# Patient Record
Sex: Male | Born: 1965 | Race: White | Hispanic: No | Marital: Married | Smoking: Never smoker
Health system: Southern US, Community
[De-identification: ages and names within clinical notes are randomized; demographics above are authoritative.]

## PROBLEM LIST (undated history)

## (undated) DIAGNOSIS — S6980XA Other specified injuries of unspecified wrist, hand and finger(s), initial encounter: Secondary | ICD-10-CM

## (undated) DIAGNOSIS — F329 Major depressive disorder, single episode, unspecified: Secondary | ICD-10-CM

## (undated) DIAGNOSIS — G5621 Lesion of ulnar nerve, right upper limb: Secondary | ICD-10-CM

## (undated) DIAGNOSIS — F32A Depression, unspecified: Secondary | ICD-10-CM

## (undated) DIAGNOSIS — M1990 Unspecified osteoarthritis, unspecified site: Secondary | ICD-10-CM

## (undated) HISTORY — PX: BACK SURGERY: SHX140

## (undated) HISTORY — PX: HERNIA REPAIR: SHX51

---

## 2004-06-22 ENCOUNTER — Encounter: Admission: RE | Admit: 2004-06-22 | Discharge: 2004-06-22 | Payer: Self-pay | Admitting: Family Medicine

## 2004-07-25 ENCOUNTER — Encounter: Admission: RE | Admit: 2004-07-25 | Discharge: 2004-07-25 | Payer: Self-pay | Admitting: Neurosurgery

## 2004-07-30 ENCOUNTER — Encounter: Admission: RE | Admit: 2004-07-30 | Discharge: 2004-07-30 | Payer: Self-pay | Admitting: Neurosurgery

## 2004-09-06 ENCOUNTER — Inpatient Hospital Stay (HOSPITAL_COMMUNITY): Admission: RE | Admit: 2004-09-06 | Discharge: 2004-09-07 | Payer: Self-pay | Admitting: Neurosurgery

## 2004-10-24 ENCOUNTER — Encounter: Admission: RE | Admit: 2004-10-24 | Discharge: 2004-10-24 | Payer: Self-pay | Admitting: Neurosurgery

## 2004-12-10 ENCOUNTER — Encounter: Admission: RE | Admit: 2004-12-10 | Discharge: 2004-12-10 | Payer: Self-pay | Admitting: Neurosurgery

## 2010-10-27 ENCOUNTER — Encounter: Payer: Self-pay | Admitting: Neurosurgery

## 2010-11-22 ENCOUNTER — Other Ambulatory Visit: Payer: Self-pay | Admitting: Neurosurgery

## 2010-11-22 DIAGNOSIS — M545 Low back pain: Secondary | ICD-10-CM

## 2010-12-05 ENCOUNTER — Ambulatory Visit
Admission: RE | Admit: 2010-12-05 | Discharge: 2010-12-05 | Disposition: A | Discharge status: Home / Self Care | Payer: 59 | Source: Ambulatory Visit | Attending: Neurosurgery | Admitting: Neurosurgery

## 2010-12-05 DIAGNOSIS — M545 Low back pain: Secondary | ICD-10-CM

## 2010-12-05 MED ORDER — GADOBENATE DIMEGLUMINE 529 MG/ML IV SOLN
15.0000 mL | Freq: Once | INTRAVENOUS | Status: AC | PRN
  Administered 2010-12-05: 15 mL via INTRAVENOUS

## 2017-09-24 DIAGNOSIS — J069 Acute upper respiratory infection, unspecified: Secondary | ICD-10-CM | POA: Diagnosis not present

## 2017-09-24 DIAGNOSIS — R05 Cough: Secondary | ICD-10-CM | POA: Diagnosis not present

## 2017-10-01 DIAGNOSIS — J209 Acute bronchitis, unspecified: Secondary | ICD-10-CM | POA: Diagnosis not present

## 2017-11-13 DIAGNOSIS — E291 Testicular hypofunction: Secondary | ICD-10-CM | POA: Diagnosis not present

## 2017-11-13 DIAGNOSIS — Z0000 Encounter for general adult medical examination without abnormal findings: Secondary | ICD-10-CM | POA: Diagnosis not present

## 2017-11-13 DIAGNOSIS — M5136 Other intervertebral disc degeneration, lumbar region: Secondary | ICD-10-CM | POA: Diagnosis not present

## 2017-11-13 DIAGNOSIS — R1310 Dysphagia, unspecified: Secondary | ICD-10-CM | POA: Diagnosis not present

## 2017-11-17 DIAGNOSIS — Z0000 Encounter for general adult medical examination without abnormal findings: Secondary | ICD-10-CM | POA: Diagnosis not present

## 2017-12-11 DIAGNOSIS — Z0000 Encounter for general adult medical examination without abnormal findings: Secondary | ICD-10-CM | POA: Diagnosis not present

## 2018-01-27 DIAGNOSIS — M2550 Pain in unspecified joint: Secondary | ICD-10-CM | POA: Diagnosis not present

## 2018-01-27 DIAGNOSIS — Z79899 Other long term (current) drug therapy: Secondary | ICD-10-CM | POA: Diagnosis not present

## 2018-01-27 DIAGNOSIS — R768 Other specified abnormal immunological findings in serum: Secondary | ICD-10-CM | POA: Diagnosis not present

## 2018-10-20 ENCOUNTER — Other Ambulatory Visit: Payer: Self-pay | Admitting: Orthopedic Surgery

## 2018-11-03 ENCOUNTER — Other Ambulatory Visit: Payer: Self-pay

## 2018-11-03 ENCOUNTER — Encounter (HOSPITAL_BASED_OUTPATIENT_CLINIC_OR_DEPARTMENT_OTHER): Payer: Self-pay | Admitting: *Deleted

## 2018-11-09 NOTE — Anesthesia Preprocedure Evaluation (Addendum)
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  Reproductive/Obstetrics                            Anesthesia Physical Anesthesia Plan  ASA: II  Anesthesia Plan: General   Post-op Pain Management: GA combined w/ Regional for post-op pain   Induction: Intravenous  PONV Risk Score and Plan: 1 and Treatment may vary due to age or medical condition, Ondansetron, Midazolam and Dexamethasone  Airway Management Planned: LMA  Additional Equipment:   Intra-op Plan:   Post-operative Plan: Extubation in OR  Informed Consent: I have reviewed the patients History and Physical, chart, labs and discussed the procedure including the risks, benefits and alternatives for the proposed anesthesia with the patient or authorized representative who has indicated his/her understanding and acceptance.     Dental advisory given  Plan Discussed with: CRNA  Anesthesia Plan Comments:        Anesthesia Quick Evaluation

## 2018-11-10 ENCOUNTER — Encounter (HOSPITAL_BASED_OUTPATIENT_CLINIC_OR_DEPARTMENT_OTHER)
Admission: RE | Disposition: A | Discharge status: Home / Self Care | Payer: Self-pay | Source: Home / Self Care | Attending: Orthopedic Surgery

## 2018-11-10 ENCOUNTER — Ambulatory Visit (HOSPITAL_BASED_OUTPATIENT_CLINIC_OR_DEPARTMENT_OTHER)
Admission: RE | Admit: 2018-11-10 | Discharge: 2018-11-10 | Disposition: A | Discharge status: Home / Self Care | Payer: 59 | Attending: Orthopedic Surgery | Admitting: Orthopedic Surgery

## 2018-11-10 ENCOUNTER — Encounter (HOSPITAL_BASED_OUTPATIENT_CLINIC_OR_DEPARTMENT_OTHER): Payer: Self-pay | Admitting: *Deleted

## 2018-11-10 ENCOUNTER — Other Ambulatory Visit: Payer: Self-pay

## 2018-11-10 ENCOUNTER — Ambulatory Visit (HOSPITAL_BASED_OUTPATIENT_CLINIC_OR_DEPARTMENT_OTHER): Payer: 59 | Admitting: Anesthesiology

## 2018-11-10 DIAGNOSIS — X501XXA Overexertion from prolonged static or awkward postures, initial encounter: Secondary | ICD-10-CM | POA: Diagnosis not present

## 2018-11-10 DIAGNOSIS — S63511A Sprain of carpal joint of right wrist, initial encounter: Secondary | ICD-10-CM | POA: Insufficient documentation

## 2018-11-10 DIAGNOSIS — Z79899 Other long term (current) drug therapy: Secondary | ICD-10-CM | POA: Diagnosis not present

## 2018-11-10 DIAGNOSIS — Z79890 Hormone replacement therapy (postmenopausal): Secondary | ICD-10-CM | POA: Diagnosis not present

## 2018-11-10 DIAGNOSIS — M65831 Other synovitis and tenosynovitis, right forearm: Secondary | ICD-10-CM | POA: Insufficient documentation

## 2018-11-10 DIAGNOSIS — Z886 Allergy status to analgesic agent status: Secondary | ICD-10-CM | POA: Diagnosis not present

## 2018-11-10 DIAGNOSIS — G5603 Carpal tunnel syndrome, bilateral upper limbs: Secondary | ICD-10-CM | POA: Insufficient documentation

## 2018-11-10 DIAGNOSIS — Z881 Allergy status to other antibiotic agents status: Secondary | ICD-10-CM | POA: Diagnosis not present

## 2018-11-10 DIAGNOSIS — Z791 Long term (current) use of non-steroidal anti-inflammatories (NSAID): Secondary | ICD-10-CM | POA: Insufficient documentation

## 2018-11-10 DIAGNOSIS — F329 Major depressive disorder, single episode, unspecified: Secondary | ICD-10-CM | POA: Insufficient documentation

## 2018-11-10 DIAGNOSIS — M1990 Unspecified osteoarthritis, unspecified site: Secondary | ICD-10-CM | POA: Diagnosis not present

## 2018-11-10 DIAGNOSIS — G5623 Lesion of ulnar nerve, bilateral upper limbs: Secondary | ICD-10-CM | POA: Diagnosis present

## 2018-11-10 HISTORY — DX: Unspecified osteoarthritis, unspecified site: M19.90

## 2018-11-10 HISTORY — PX: CARPAL TUNNEL RELEASE: SHX101

## 2018-11-10 HISTORY — DX: Depression, unspecified: F32.A

## 2018-11-10 HISTORY — PX: WRIST ARTHROSCOPY WITH DEBRIDEMENT: SHX6194

## 2018-11-10 HISTORY — DX: Lesion of ulnar nerve, right upper limb: G56.21

## 2018-11-10 HISTORY — PX: ULNAR NERVE TRANSPOSITION: SHX2595

## 2018-11-10 HISTORY — DX: Major depressive disorder, single episode, unspecified: F32.9

## 2018-11-10 SURGERY — CARPAL TUNNEL RELEASE
Anesthesia: General | Site: Wrist | Laterality: Right

## 2018-11-10 MED ORDER — OXYCODONE HCL 5 MG/5ML PO SOLN: 5.0000 mg | Freq: Once | ORAL | Status: DC | PRN

## 2018-11-10 MED ORDER — FENTANYL CITRATE (PF) 100 MCG/2ML IJ SOLN
50.0000 ug | INTRAMUSCULAR | Status: DC | PRN
  Administered 2018-11-10: 50 ug via INTRAVENOUS

## 2018-11-10 MED ORDER — PROMETHAZINE HCL 25 MG/ML IJ SOLN: 6.2500 mg | INTRAMUSCULAR | Status: DC | PRN

## 2018-11-10 MED ORDER — OXYCODONE HCL 5 MG PO TABS: 5.0000 mg | ORAL_TABLET | Freq: Once | ORAL | Status: DC | PRN

## 2018-11-10 MED ORDER — HYDROCODONE-ACETAMINOPHEN 5-325 MG PO TABS: 1.0000 | ORAL_TABLET | Freq: Four times a day (QID) | ORAL | 0 refills | Status: DC | PRN

## 2018-11-10 MED ORDER — MIDAZOLAM HCL 2 MG/2ML IJ SOLN
1.0000 mg | INTRAMUSCULAR | Status: DC | PRN
  Administered 2018-11-10: 1 mg via INTRAVENOUS

## 2018-11-10 MED ORDER — ONDANSETRON HCL 4 MG/2ML IJ SOLN
INTRAMUSCULAR | Status: AC
  Filled 2018-11-10: qty 2

## 2018-11-10 MED ORDER — CHLORHEXIDINE GLUCONATE 4 % EX LIQD: 60.0000 mL | Freq: Once | CUTANEOUS | Status: DC

## 2018-11-10 MED ORDER — FENTANYL CITRATE (PF) 100 MCG/2ML IJ SOLN: 25.0000 ug | INTRAMUSCULAR | Status: DC | PRN

## 2018-11-10 MED ORDER — SCOPOLAMINE 1 MG/3DAYS TD PT72: 1.0000 | MEDICATED_PATCH | Freq: Once | TRANSDERMAL | Status: DC | PRN

## 2018-11-10 MED ORDER — PROPOFOL 10 MG/ML IV BOLUS
INTRAVENOUS | Status: DC | PRN
  Administered 2018-11-10: 150 mg via INTRAVENOUS

## 2018-11-10 MED ORDER — BUPIVACAINE-EPINEPHRINE (PF) 0.5% -1:200000 IJ SOLN
INTRAMUSCULAR | Status: DC | PRN
  Administered 2018-11-10: 30 mL via PERINEURAL

## 2018-11-10 MED ORDER — LACTATED RINGERS IV SOLN
INTRAVENOUS | Status: DC
  Administered 2018-11-10 (×2): via INTRAVENOUS

## 2018-11-10 MED ORDER — ACETAMINOPHEN 10 MG/ML IV SOLN: 1000.0000 mg | Freq: Once | INTRAVENOUS | Status: DC | PRN

## 2018-11-10 MED ORDER — LIDOCAINE 2% (20 MG/ML) 5 ML SYRINGE
INTRAMUSCULAR | Status: DC | PRN
  Administered 2018-11-10: 40 mg via INTRAVENOUS

## 2018-11-10 MED ORDER — CEFAZOLIN SODIUM-DEXTROSE 2-4 GM/100ML-% IV SOLN
INTRAVENOUS | Status: AC
  Filled 2018-11-10: qty 100

## 2018-11-10 MED ORDER — ONDANSETRON HCL 4 MG/2ML IJ SOLN
INTRAMUSCULAR | Status: DC | PRN
  Administered 2018-11-10: 4 mg via INTRAVENOUS

## 2018-11-10 MED ORDER — FENTANYL CITRATE (PF) 100 MCG/2ML IJ SOLN
INTRAMUSCULAR | Status: AC
  Filled 2018-11-10: qty 2

## 2018-11-10 MED ORDER — DEXAMETHASONE SODIUM PHOSPHATE 10 MG/ML IJ SOLN
INTRAMUSCULAR | Status: DC | PRN
  Administered 2018-11-10: 10 mg via INTRAVENOUS

## 2018-11-10 MED ORDER — DEXAMETHASONE SODIUM PHOSPHATE 10 MG/ML IJ SOLN
INTRAMUSCULAR | Status: AC
  Filled 2018-11-10: qty 1

## 2018-11-10 MED ORDER — CEFAZOLIN SODIUM-DEXTROSE 2-4 GM/100ML-% IV SOLN
2.0000 g | INTRAVENOUS | Status: AC
  Administered 2018-11-10: 2 g via INTRAVENOUS

## 2018-11-10 MED ORDER — MIDAZOLAM HCL 2 MG/2ML IJ SOLN
INTRAMUSCULAR | Status: AC
  Filled 2018-11-10: qty 2

## 2018-11-10 SURGICAL SUPPLY — 91 items
BLADE CUDA 2.0 (BLADE) IMPLANT
BLADE EAR TYMPAN 2.5 60D BEAV (BLADE) IMPLANT
BLADE MINI RND TIP GREEN BEAV (BLADE) IMPLANT
BLADE SURG 15 STRL LF DISP TIS (BLADE) ×2 IMPLANT
BLADE SURG 15 STRL SS (BLADE) ×4
BNDG COHESIVE 3X5 TAN STRL LF (GAUZE/BANDAGES/DRESSINGS) ×8 IMPLANT
BNDG ESMARK 4X9 LF (GAUZE/BANDAGES/DRESSINGS) ×4 IMPLANT
BNDG GAUZE ELAST 4 BULKY (GAUZE/BANDAGES/DRESSINGS) ×4 IMPLANT
BUR CUDA 2.9 (BURR) IMPLANT
BUR CUDA 2.9MM (BURR)
BUR FULL RADIUS 2.0 (BURR) ×3 IMPLANT
BUR FULL RADIUS 2.0MM (BURR) ×1
BUR FULL RADIUS 2.9 (BURR) IMPLANT
BUR FULL RADIUS 2.9MM (BURR)
BUR GATOR 2.9 (BURR) IMPLANT
BUR GATOR 2.9MM (BURR)
BUR SPHERICAL 2.9 (BURR) IMPLANT
BUR SPHERICAL 2.9MM (BURR)
CANISTER SUCT 1200ML W/VALVE (MISCELLANEOUS) ×4 IMPLANT
CHLORAPREP W/TINT 26ML (MISCELLANEOUS) ×4 IMPLANT
CORD BIPOLAR FORCEPS 12FT (ELECTRODE) ×4 IMPLANT
COVER BACK TABLE 60X90IN (DRAPES) ×4 IMPLANT
COVER MAYO STAND STRL (DRAPES) ×4 IMPLANT
COVER WAND RF STERILE (DRAPES) IMPLANT
CUFF TOURN SGL LL 18 NRW (TOURNIQUET CUFF) ×4 IMPLANT
CUFF TOURNIQUET SINGLE 18IN (TOURNIQUET CUFF) ×4 IMPLANT
DECANTER SPIKE VIAL GLASS SM (MISCELLANEOUS) IMPLANT
DRAPE EXTREMITY T 121X128X90 (DISPOSABLE) ×4 IMPLANT
DRAPE IMP U-DRAPE 54X76 (DRAPES) ×4 IMPLANT
DRAPE OEC MINIVIEW 54X84 (DRAPES) IMPLANT
DRAPE SURG 17X23 STRL (DRAPES) ×4 IMPLANT
DRSG PAD ABDOMINAL 8X10 ST (GAUZE/BANDAGES/DRESSINGS) ×4 IMPLANT
ELECT SMALL JOINT 90D BASC (ELECTRODE) IMPLANT
GAUZE 4X4 16PLY RFD (DISPOSABLE) IMPLANT
GAUZE SPONGE 4X4 12PLY STRL (GAUZE/BANDAGES/DRESSINGS) ×4 IMPLANT
GAUZE XEROFORM 1X8 LF (GAUZE/BANDAGES/DRESSINGS) ×4 IMPLANT
GLOVE BIO SURGEON STRL SZ 6.5 (GLOVE) ×3 IMPLANT
GLOVE BIO SURGEONS STRL SZ 6.5 (GLOVE) ×1
GLOVE BIOGEL PI IND STRL 7.0 (GLOVE) ×4 IMPLANT
GLOVE BIOGEL PI IND STRL 8.5 (GLOVE) ×2 IMPLANT
GLOVE BIOGEL PI INDICATOR 7.0 (GLOVE) ×4
GLOVE BIOGEL PI INDICATOR 8.5 (GLOVE) ×2
GLOVE SURG ORTHO 8.0 STRL STRW (GLOVE) ×4 IMPLANT
GOWN STRL REUS W/ TWL LRG LVL3 (GOWN DISPOSABLE) ×2 IMPLANT
GOWN STRL REUS W/TWL LRG LVL3 (GOWN DISPOSABLE) ×4
GOWN STRL REUS W/TWL XL LVL3 (GOWN DISPOSABLE) ×8 IMPLANT
IV NS IRRIG 3000ML ARTHROMATIC (IV SOLUTION) ×4 IMPLANT
IV SET EXT 30 76VOL 4 MALE LL (IV SETS) ×4 IMPLANT
LOOP VESSEL MAXI BLUE (MISCELLANEOUS) IMPLANT
MANIFOLD NEPTUNE II (INSTRUMENTS) IMPLANT
NDL SAFETY ECLIPSE 18X1.5 (NEEDLE) ×2 IMPLANT
NEEDLE HYPO 18GX1.5 SHARP (NEEDLE) ×4
NEEDLE HYPO 22GX1.5 SAFETY (NEEDLE) ×4 IMPLANT
NEEDLE PRECISIONGLIDE 27X1.5 (NEEDLE) IMPLANT
NEEDLE SPNL 18GX3.5 QUINCKE PK (NEEDLE) IMPLANT
NEEDLE TUOHY 20GX3.5 (NEEDLE) IMPLANT
NS IRRIG 1000ML POUR BTL (IV SOLUTION) ×4 IMPLANT
PACK BASIN DAY SURGERY FS (CUSTOM PROCEDURE TRAY) ×4 IMPLANT
PAD CAST 3X4 CTTN HI CHSV (CAST SUPPLIES) ×2 IMPLANT
PAD CAST 4YDX4 CTTN HI CHSV (CAST SUPPLIES) ×2 IMPLANT
PADDING CAST ABS 3INX4YD NS (CAST SUPPLIES) ×2
PADDING CAST ABS 4INX4YD NS (CAST SUPPLIES) ×2
PADDING CAST ABS COTTON 3X4 (CAST SUPPLIES) ×2 IMPLANT
PADDING CAST ABS COTTON 4X4 ST (CAST SUPPLIES) ×2 IMPLANT
PADDING CAST COTTON 3X4 STRL (CAST SUPPLIES) ×4
PADDING CAST COTTON 4X4 STRL (CAST SUPPLIES) ×4
ROUTER HOODED VORTEX 2.9MM (BLADE) IMPLANT
SET SM JOINT TUBING/CANN (CANNULA) IMPLANT
SLEEVE SCD COMPRESS KNEE MED (MISCELLANEOUS) ×4 IMPLANT
SPLINT PLASTER CAST XFAST 3X15 (CAST SUPPLIES) ×60 IMPLANT
SPLINT PLASTER XTRA FASTSET 3X (CAST SUPPLIES) ×60
STOCKINETTE 4X48 STRL (DRAPES) ×4 IMPLANT
SUCTION FRAZIER HANDLE 10FR (MISCELLANEOUS)
SUCTION TUBE FRAZIER 10FR DISP (MISCELLANEOUS) IMPLANT
SUT ETHILON 4 0 PS 2 18 (SUTURE) ×4 IMPLANT
SUT MERSILENE 4 0 P 3 (SUTURE) IMPLANT
SUT PDS AB 2-0 CT2 27 (SUTURE) IMPLANT
SUT STEEL 4 0 (SUTURE) IMPLANT
SUT VIC AB 2-0 PS2 27 (SUTURE) ×4 IMPLANT
SUT VIC AB 2-0 SH 27 (SUTURE) ×3
SUT VIC AB 2-0 SH 27XBRD (SUTURE) ×2 IMPLANT
SUT VICRYL 4-0 PS2 18IN ABS (SUTURE) ×4 IMPLANT
SYR BULB 3OZ (MISCELLANEOUS) ×4 IMPLANT
SYR CONTROL 10ML LL (SYRINGE) ×4 IMPLANT
TOWEL GREEN STERILE FF (TOWEL DISPOSABLE) ×4 IMPLANT
TUBE CONNECTING 20'X1/4 (TUBING)
TUBE CONNECTING 20X1/4 (TUBING) IMPLANT
TUBING ARTHROSCOPY IRRIG 16FT (MISCELLANEOUS) ×4 IMPLANT
UNDERPAD 30X30 (UNDERPADS AND DIAPERS) ×4 IMPLANT
WAND SHORT BEVEL W/CORD (SURGICAL WAND) ×4 IMPLANT
WATER STERILE IRR 1000ML POUR (IV SOLUTION) ×4 IMPLANT

## 2018-11-10 NOTE — Op Note (Signed)
I assisted Surgeon(s) and Role:    * Cindee Salt, MD - Primary    * Betha Loa, MD - Assisting on the Procedure(s): CARPAL TUNNEL RELEASE WRIST ARTHROSCOPY WITH DEBRIDEMENT ULNAR NERVE DECOMPRESSION/TRANSPOSITION on 11/10/2018.  I provided assistance on this case as follows: positioning of arm, management of equipment, retraction soft tissues.  Electronically signed by: Betha Loa, MD Date: 11/10/2018 Time: 2:54 PM

## 2018-11-10 NOTE — Discharge Instructions (Addendum)
° °  ° ° ° °Hand Center Instructions °Hand Surgery ° °Wound Care: °Keep your hand elevated above the level of your heart.  Do not allow it to dangle by your side.  Keep the dressing dry and do not remove it unless your doctor advises you to do so.  He will usually change it at the time of your post-op visit.  Moving your fingers is advised to stimulate circulation but will depend on the site of your surgery.  If you have a splint applied, your doctor will advise you regarding movement. ° °Activity: °Do not drive or operate machinery today.  Rest today and then you may return to your normal activity and work as indicated by your physician. ° °Diet:  °Drink liquids today or eat a light diet.  You may resume a regular diet tomorrow.   ° °General expectations: °Pain for two to three days. °Fingers may become slightly swollen. ° °Call your doctor if any of the following occur: °Severe pain not relieved by pain medication. °Elevated temperature. °Dressing soaked with blood. °Inability to move fingers. °White or bluish color to fingers. ° ° °Regional Anesthesia Blocks ° °1. Numbness or the inability to move the "blocked" extremity may last from 3-48 hours after placement. The length of time depends on the medication injected and your individual response to the medication. If the numbness is not going away after 48 hours, call your surgeon. ° °2. The extremity that is blocked will need to be protected until the numbness is gone and the  Strength has returned. Because you cannot feel it, you will need to take extra care to avoid injury. Because it may be weak, you may have difficulty moving it or using it. You may not know what position it is in without looking at it while the block is in effect. ° °3. For blocks in the legs and feet, returning to weight bearing and walking needs to be done carefully. You will need to wait until the numbness is entirely gone and the strength has returned. You should be able to move your leg  and foot normally before you try and bear weight or walk. You will need someone to be with you when you first try to ensure you do not fall and possibly risk injury. ° °4. Bruising and tenderness at the needle site are common side effects and will resolve in a few days. ° °5. Persistent numbness or new problems with movement should be communicated to the surgeon or the [facility] ([phone])/ [facility] ([phone]). ° ° ° °Post Anesthesia Home Care Instructions ° °Activity: °Get plenty of rest for the remainder of the day. A responsible individual must stay with you for 24 hours following the procedure.  °For the next 24 hours, DO NOT: °-Drive a car °-Operate machinery °-Drink alcoholic beverages °-Take any medication unless instructed by your physician °-Make any legal decisions or sign important papers. ° °Meals: °Start with liquid foods such as gelatin or soup. Progress to regular foods as tolerated. Avoid greasy, spicy, heavy foods. If nausea and/or vomiting occur, drink only clear liquids until the nausea and/or vomiting subsides. Call your physician if vomiting continues. ° °Special Instructions/Symptoms: °Your throat may feel dry or sore from the anesthesia or the breathing tube placed in your throat during surgery. If this causes discomfort, gargle with warm salt water. The discomfort should disappear within 24 hours. ° °If you had a scopolamine patch placed behind your ear for   the management of post- operative nausea and/or vomiting: ° °1. The medication in the patch is effective for 72 hours, after which it should be removed.  Wrap patch in a tissue and discard in the trash. Wash hands thoroughly with soap and water. °2. You may remove the patch earlier than 72 hours if you experience unpleasant side effects which may include dry mouth, dizziness or visual disturbances. °3. Avoid touching the patch. Wash your hands with soap and water after contact with the patch. °  ° ° °

## 2018-11-10 NOTE — Transfer of Care (Signed)
Immediate Anesthesia Transfer of Care Note  Patient: Tony [name]  Procedure(s) Performed: CARPAL TUNNEL RELEASE (Right Wrist) WRIST ARTHROSCOPY WITH DEBRIDEMENT WITH SHINKAGE (Right Wrist) ULNAR NERVE DECOMPRESSION/TRANSPOSITION (Right Elbow)  Patient Location: PACU  Anesthesia Type:GA combined with regional for post-op pain  Level of Consciousness: drowsy and patient cooperative  Airway & Oxygen Therapy: Patient Spontanous Breathing and Patient connected to face mask oxygen  Post-op Assessment: Report given to RN and Post -op Vital signs reviewed and stable  Post vital signs: Reviewed and stable  Last Vitals:  Vitals Value Taken Time  BP 125/85 11/10/2018  2:57 PM  Temp    Pulse 66 11/10/2018  2:59 PM  Resp 11 11/10/2018  2:59 PM  SpO2 98 % 11/10/2018  2:59 PM  Vitals shown include unvalidated device data.  Last Pain:  Vitals:   11/10/18 1204  TempSrc: Oral  PainSc: 1       Patients Stated Pain Goal: 1 (11/10/18 1204)  Complications: No apparent anesthesia complications

## 2018-11-10 NOTE — Progress Notes (Signed)
Assisted Dr. Howze with right, ultrasound guided, supraclavicular block. Side rails up, monitors on throughout procedure. See vital signs in flow sheet. Tolerated Procedure well. 

## 2018-11-10 NOTE — Anesthesia Postprocedure Evaluation (Signed)
Anesthesia Post Note  Patient: Tony [name]  Procedure(s) Performed: CARPAL TUNNEL RELEASE (Right Wrist) WRIST ARTHROSCOPY WITH DEBRIDEMENT WITH SHINKAGE (Right Wrist) ULNAR NERVE DECOMPRESSION/TRANSPOSITION (Right Elbow)     Patient location during evaluation: PACU Anesthesia Type: General Level of consciousness: awake and alert Pain management: pain level controlled Vital Signs Assessment: post-procedure vital signs reviewed and stable Respiratory status: spontaneous breathing, nonlabored ventilation, respiratory function stable and patient connected to nasal cannula oxygen Cardiovascular status: blood pressure returned to baseline and stable Postop Assessment: no apparent nausea or vomiting Anesthetic complications: yes Anesthetic complication details: anesthesia complicationsComments: Patient brought to PACU with plastic oral airway in place. Upon removal, left front incisor crown was dislodged. Crown kept for patient who states he will seek dental consult as outpatient. States he has had problems with loose crowns on 2 front incisors in the past which have had to be "re-glued".    Last Vitals:  Vitals:   11/10/18 1500 11/10/18 1515  BP: 121/85 108/78  Pulse: 69 62  Resp: 11 16  Temp:    SpO2: 98% 98%    Last Pain:  Vitals:   11/10/18 1515  TempSrc:   PainSc: 0-No pain                 Kaylyn Layer

## 2018-11-10 NOTE — Op Note (Signed)
NAME: Tony [name] MEDICAL RECORD NO: [id number] DATE OF BIRTH: 09/22/1966 [facility]ATION: [facility] PHYSICIAN: Nicki ReaperGARY R. Jennine Peddy, MD   OPERATIVE REPORT   DATE OF PROCEDURE: 11/10/18    PREOPERATIVE DIAGNOSIS:   Scapholunate ligament tear cubital tunnel syndrome carpal tunnel syndrome right arm   POSTOPERATIVE DIAGNOSIS:   Scapholunate ligament tear lunotriquetral ligament tear significant arthritis proximal hamate ulnar capitate with cubital tunnel and carpal tunnel syndrome right arm   PROCEDURE:   Arthroscopy with debridement of scapholunate lunotriquetral with shrinkage of scapholunate ligament carpal tunnel release and transposition ulnar nerve right arm   SURGEON: Cindee SaltGary Kalle Bernath, M.D.   ASSISTANT: Betha LoaKevin Shiasia Porro, MD   ANESTHESIA:  General with regional   INTRAVENOUS FLUIDS:  Per anesthesia flow sheet.   ESTIMATED BLOOD LOSS:  Minimal.   COMPLICATIONS:  None.   SPECIMENS:  none   TOURNIQUET TIME:    Total Tourniquet Time Documented: Upper Arm (Right) - 43 minutes Total: Upper Arm (Right) - 43 minutes    DISPOSITION:  Stable to PACU.   INDICATIONS: Patient is a [age] year old male sustained a twisting injury to his right arm and he complains of ulnar wrist pain.  He is also complaining of numbness and tingling in the hand.  Nerve conductions reveal cubital tunnel and carpal tunnel syndrome right arm MRI reveals scapholunate ligament complex tear.  This is not responded to conservative treatment including injections.  He has elected undergo arthroscopy of his wrist with transposition of the ulnar nerve with positive nerve conductions and ultrasound revealing subluxation of the ulnar nerve at the elbow and carpal tunnel syndrome right hand.  Pre-peri-and postoperative course been discussed along with risks and complications.  He is aware that there is no guarantee to the surgery the possibility of infection recurrence injury to arteries nerves tendons  incomplete relief of symptoms and dystrophy.  In the preoperative area the patient is seen the extremity marked by both patient and surgeon antibiotic given  OPERATIVE COURSE: A regional block was carried out in the preoperative area under the direction of the anesthesia department.  He was brought to the operating placed in a supine position general anesthetic was added.  He was prepped and draped using ChloraPrep.  A three-minute dry time was allowed a timeout taken to confirm patient procedure.  The right arm was placed in the arc arthroscopy tower.  10 pounds traction applied.  The joint was inferior inflated to the 3-4 portal a transverse incision made deepened with a hemostat and a blunt trocar was used to enter the joint.  The joint was inspected a scapholunate ligament, complex tear was immediately apparent.  There was no significant rotation of the scaphoid.  The ulnar side of the wrist was examined after examination of the volar radial wrist ligaments which were intact.  He is noted showed no significant degenerative changes on the radial carpal joint.  The triangular fibrocartilage complex was intact.  He showed obvious lunotriquetral tear with portions of the ligament hanging down into the joint.  Irrigation catheter was placed in 6 you a 4-5 portal was then opened after localization with a 22-gauge needle.  Blunt trocar was used to enter the joint after hemostat was used to dissect down to the level of the capsule.  The scope was introduced in the 4-5 portal and inspection of the lunotriquetral joint was performed.  An ArthroWand was then inserted and shrinkage performed to the scapholunate ligament complex.  Debridement was performed by alternating  the scope and 34 and 45 portals using full-radius shaver.  Midcarpal joint was then inspected through the ulnar midcarpal portal after localization with a 22-gauge needle.  A transverse incision made deepened with a hemostat blunt trocar used to enter the  joint he had a type II lunate.  He showed significant degenerative changes of the proximal aspect of the hamate and a small portion of the ulnar aspect of the capitate.  Instability was noted to the scapholunate lunotriquetral joint but there was no significant degenerative changes in the majority of the proximal capitate.  The instruments were removed the portals closed with interrupted 4-0 nylon sutures the arc arthroscopy tower removed.  The limb was then exsanguinated with an Esmarch bandage turn placed on the arm was inflated to 250 mmHg.  A longitudinal incision was made in the palm carried down through subcutaneous tissue 2.  Bleeders were electrocauterized with bipolar.  The palmar fascia was split.  The superficial palmar arch identified.  The flexors of the ring little finger identified.  Retractors were placed retracting median nerve radially the ulnar nerve ulnarly and the flexor retinaculum was opened on its ulnar border off the hamate hook and a right angle and stool retractor placed between skin and forearm fascia proximally the proximal aspect of the flexor retinaculum distal forearm fascia was then released with blunt incisions after dissecting deep structures free.  The nerve was examined motor branch entered in the muscle distally.  A very significant synovitis was present to the tenosynovial tissue.  The wound was irrigated and closed with interrupted 4-0 nylon sutures.  The elbow was attended to next a curvilinear incision was made over the medial epicondyle carried down through subcutaneous tissue bleeders again electrocauterized with bipolar.  The dissection carried down to Osborne's fascia which was markedly stretched.  The ulnar nerve was identified traced proximally taking care to preserve the associated vessels.  The posterior branches of the medial cutaneous nerve of the forearm were identified and protected.  The dissection carried proximally distally freeing the ulnar nerve through the  flexor carpi ulnaris.  A portion of flexor carpi ulnaris fascia origin was preserved transected and left attached to the medial epicondyle.  The medial intermuscular septum proximally was dissected down to the humerus.  This was left attached to the epicondyle with care was taken to protect the vessels protruding through this.  The nerve was then mobilized and anteriorly transposed.  The 2 slings were then used to stabilize the nerve by suturing these to the subcutaneous tissue laterally.  This was done with figure-of-eight 2-0 Vicryl sutures.  The wound was irrigated.  The skin was closed in layers with 2-0 Vicryl and 4-0 nylon sutures.  No subluxation to the nerve with motion of the elbow.  A sterile compressive dressing long-arm splint was applied.  Inflation of the tourniquet all fingers immediately pink.  He was taken to the recovery room for observation in satisfactory condition.  He will be discharged home to return [facility] [place] and 1 week on Norco.  Try Tylenol ibuprofen but has Norco for breakthrough.   Cindee SaltGary Jerl Munyan, MD Electronically signed, 11/10/18

## 2018-11-10 NOTE — Brief Op Note (Signed)
11/10/2018  2:51 PM  PATIENT:  Tony [name]  53 y.o. male  PRE-OPERATIVE DIAGNOSIS:  CUBITAL TUNNEL RIGHT,CARPAL TUNNEL SYNDROME RIGHT,TEAR RIGHT  POST-OPERATIVE DIAGNOSIS:  CUBITAL TUNNEL RIGHT,CARPAL TUNNEL SYNDROME RIGHT,TEAR RIGHT  PROCEDURE:  Procedure(s): CARPAL TUNNEL RELEASE (Right) WRIST ARTHROSCOPY WITH DEBRIDEMENT (Right) ULNAR NERVE DECOMPRESSION/TRANSPOSITION (Right)  SURGEON:  Surgeon(s) and Role:    * Cindee Salt, MD - Primary    * Betha Loa, MD - Assisting  PHYSICIAN ASSISTANT:   ASSISTANTS: K Sharita Bienaime,MD   ANESTHESIA:   regional and general  EBL:  1 mL   BLOOD ADMINISTERED:none  DRAINS: none   LOCAL MEDICATIONS USED:  NONE  SPECIMEN:  No Specimen  DISPOSITION OF SPECIMEN:  N/A  COUNTS:  YES  TOURNIQUET:   Total Tourniquet Time Documented: Upper Arm (Right) - 43 minutes Total: Upper Arm (Right) - 43 minutes   DICTATION: .[name] Dictation  PLAN OF CARE: Discharge to home after PACU  PATIENT DISPOSITION:  PACU - hemodynamically stable.
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
## 2018-11-10 NOTE — Anesthesia Procedure Notes (Signed)
Anesthesia Regional Block: Supraclavicular block   Pre-Anesthetic Checklist: ,, timeout performed, Correct Patient, Correct Site, Correct Laterality, Correct Procedure, Correct Position, site marked, Risks and benefits discussed, pre-op evaluation,  At surgeon's request and post-op pain management  Laterality: Right  Prep: Maximum Sterile Barrier Precautions used, chloraprep       Needles:  Injection technique: Single-shot  Needle Type: Echogenic Stimulator Needle     Needle Length: 9cm  Needle Gauge: 22     Additional Needles:   Procedures:,,,, ultrasound used (permanent image in chart),,,,  Narrative:  Start time: 11/10/2018 12:37 PM End time: 11/10/2018 12:39 PM Injection made incrementally with aspirations every 5 mL.  Performed by: Personally  Anesthesiologist: Kaylyn Layer, MD  Additional Notes: Risks, benefits, and alternative discussed. Patient gave consent for procedure. Patient prepped and draped in sterile fashion. Sedation administered, patient remains easily responsive to voice. Relevant anatomy identified with ultrasound guidance. Local anesthetic given in 5cc increments with no signs or symptoms of intravascular injection. No pain or paraesthesias with injection. Patient monitored throughout procedure with signs of LAST or immediate complications. Tolerated well. Ultrasound image placed in chart.  Amalia Greenhouse, MD

## 2018-11-10 NOTE — Progress Notes (Signed)
Pt stated after airway out, that his crown was out. Had him spit it in a gauze to save. Dr. Stephannie Peters at bedside, notified of pt's crown out. She examined his mouth. He said this crown tends to come out.

## 2018-11-10 NOTE — Anesthesia Procedure Notes (Signed)
Procedure Name: LMA Insertion Date/Time: 11/10/2018 1:19 PM Performed by: Pearson Grippeobertson, Filippo Puls M, CRNA Pre-anesthesia Checklist: Patient identified, Emergency Drugs available, Suction available and Patient being monitored Patient Re-evaluated:Patient Re-evaluated prior to induction Oxygen Delivery Method: Circle system utilized Preoxygenation: Pre-oxygenation with 100% oxygen Induction Type: IV induction Ventilation: Mask ventilation without difficulty LMA: LMA inserted LMA Size: 4.0 Number of attempts: 1 Airway Equipment and Method: Bite block Placement Confirmation: positive ETCO2 Tube secured with: Tape Dental Injury: Teeth and Oropharynx as per pre-operative assessment

## 2018-11-11 ENCOUNTER — Encounter (HOSPITAL_BASED_OUTPATIENT_CLINIC_OR_DEPARTMENT_OTHER): Payer: Self-pay | Admitting: Orthopedic Surgery

## 2019-04-23 ENCOUNTER — Other Ambulatory Visit: Payer: Self-pay | Admitting: Orthopedic Surgery

## 2019-04-23 DIAGNOSIS — S63591D Other specified sprain of right wrist, subsequent encounter: Secondary | ICD-10-CM

## 2019-05-19 ENCOUNTER — Ambulatory Visit
Admission: RE | Admit: 2019-05-19 | Discharge: 2019-05-19 | Disposition: A | Discharge status: Home / Self Care | Payer: 59 | Source: Ambulatory Visit | Attending: Orthopedic Surgery | Admitting: Orthopedic Surgery

## 2019-05-19 ENCOUNTER — Other Ambulatory Visit: Payer: Self-pay

## 2019-05-19 DIAGNOSIS — S63591D Other specified sprain of right wrist, subsequent encounter: Secondary | ICD-10-CM

## 2019-05-19 MED ORDER — IOPAMIDOL (ISOVUE-M 200) INJECTION 41%
1.5000 mL | Freq: Once | INTRAMUSCULAR | Status: AC
  Administered 2019-05-19: 1.5 mL via INTRA_ARTICULAR

## 2019-06-07 ENCOUNTER — Other Ambulatory Visit: Payer: Self-pay | Admitting: Orthopedic Surgery

## 2019-07-05 ENCOUNTER — Encounter (HOSPITAL_BASED_OUTPATIENT_CLINIC_OR_DEPARTMENT_OTHER): Payer: Self-pay | Admitting: *Deleted

## 2019-07-05 ENCOUNTER — Other Ambulatory Visit: Payer: Self-pay

## 2019-07-09 ENCOUNTER — Other Ambulatory Visit (HOSPITAL_COMMUNITY)
Admission: RE | Admit: 2019-07-09 | Discharge: 2019-07-09 | Disposition: A | Discharge status: Home / Self Care | Payer: 59 | Source: Ambulatory Visit | Attending: Orthopedic Surgery | Admitting: Orthopedic Surgery

## 2019-07-09 DIAGNOSIS — Z01812 Encounter for preprocedural laboratory examination: Secondary | ICD-10-CM | POA: Insufficient documentation

## 2019-07-09 DIAGNOSIS — Z20828 Contact with and (suspected) exposure to other viral communicable diseases: Secondary | ICD-10-CM | POA: Insufficient documentation

## 2019-07-11 LAB — NOVEL CORONAVIRUS, NAA (HOSP ORDER, SEND-OUT TO REF LAB; TAT 18-24 HRS): SARS-CoV-2, NAA: NOT DETECTED

## 2019-07-13 ENCOUNTER — Ambulatory Visit (HOSPITAL_BASED_OUTPATIENT_CLINIC_OR_DEPARTMENT_OTHER)
Admission: RE | Admit: 2019-07-13 | Discharge: 2019-07-13 | Disposition: A | Discharge status: Home / Self Care | Payer: 59 | Attending: Orthopedic Surgery | Admitting: Orthopedic Surgery

## 2019-07-13 ENCOUNTER — Encounter (HOSPITAL_BASED_OUTPATIENT_CLINIC_OR_DEPARTMENT_OTHER)
Admission: RE | Disposition: A | Discharge status: Home / Self Care | Payer: Self-pay | Source: Home / Self Care | Attending: Orthopedic Surgery

## 2019-07-13 ENCOUNTER — Ambulatory Visit (HOSPITAL_BASED_OUTPATIENT_CLINIC_OR_DEPARTMENT_OTHER): Payer: 59 | Admitting: Anesthesiology

## 2019-07-13 ENCOUNTER — Encounter (HOSPITAL_BASED_OUTPATIENT_CLINIC_OR_DEPARTMENT_OTHER): Payer: Self-pay | Admitting: Anesthesiology

## 2019-07-13 DIAGNOSIS — W19XXXA Unspecified fall, initial encounter: Secondary | ICD-10-CM | POA: Diagnosis not present

## 2019-07-13 DIAGNOSIS — X58XXXA Exposure to other specified factors, initial encounter: Secondary | ICD-10-CM | POA: Diagnosis not present

## 2019-07-13 DIAGNOSIS — S63591A Other specified sprain of right wrist, initial encounter: Secondary | ICD-10-CM | POA: Insufficient documentation

## 2019-07-13 HISTORY — PX: WRIST ARTHROSCOPY WITH DEBRIDEMENT: SHX6194

## 2019-07-13 HISTORY — DX: Other specified injuries of unspecified wrist, hand and finger(s), initial encounter: S69.80XA

## 2019-07-13 SURGERY — WRIST ARTHROSCOPY WITH DEBRIDEMENT
Anesthesia: General | Site: Wrist | Laterality: Right

## 2019-07-13 MED ORDER — CLONIDINE HCL (ANALGESIA) 100 MCG/ML EP SOLN
EPIDURAL | Status: DC | PRN
  Administered 2019-07-13: 100 ug

## 2019-07-13 MED ORDER — ACETAMINOPHEN 160 MG/5ML PO SOLN: 325.0000 mg | ORAL | Status: DC | PRN

## 2019-07-13 MED ORDER — CEFAZOLIN SODIUM-DEXTROSE 2-4 GM/100ML-% IV SOLN
2.0000 g | INTRAVENOUS | Status: AC
  Administered 2019-07-13: 2 g via INTRAVENOUS

## 2019-07-13 MED ORDER — FENTANYL CITRATE (PF) 100 MCG/2ML IJ SOLN: 25.0000 ug | INTRAMUSCULAR | Status: DC | PRN

## 2019-07-13 MED ORDER — OXYCODONE HCL 5 MG/5ML PO SOLN: 5.0000 mg | Freq: Once | ORAL | Status: DC | PRN

## 2019-07-13 MED ORDER — TRAMADOL HCL 50 MG PO TABS: 50.0000 mg | ORAL_TABLET | Freq: Four times a day (QID) | ORAL | 0 refills | Status: AC | PRN

## 2019-07-13 MED ORDER — SCOPOLAMINE 1 MG/3DAYS TD PT72: 1.0000 | MEDICATED_PATCH | Freq: Once | TRANSDERMAL | Status: DC

## 2019-07-13 MED ORDER — FENTANYL CITRATE (PF) 100 MCG/2ML IJ SOLN
50.0000 ug | INTRAMUSCULAR | Status: DC | PRN
  Administered 2019-07-13 (×2): 50 ug via INTRAVENOUS

## 2019-07-13 MED ORDER — MIDAZOLAM HCL 2 MG/2ML IJ SOLN
INTRAMUSCULAR | Status: AC
  Filled 2019-07-13: qty 2

## 2019-07-13 MED ORDER — FENTANYL CITRATE (PF) 100 MCG/2ML IJ SOLN
INTRAMUSCULAR | Status: AC
  Filled 2019-07-13: qty 2

## 2019-07-13 MED ORDER — PROPOFOL 500 MG/50ML IV EMUL
INTRAVENOUS | Status: DC | PRN
  Administered 2019-07-13: 100 ug/kg/min via INTRAVENOUS

## 2019-07-13 MED ORDER — ONDANSETRON HCL 4 MG/2ML IJ SOLN: 4.0000 mg | Freq: Once | INTRAMUSCULAR | Status: DC | PRN

## 2019-07-13 MED ORDER — MIDAZOLAM HCL 2 MG/2ML IJ SOLN
1.0000 mg | INTRAMUSCULAR | Status: DC | PRN
  Administered 2019-07-13 (×2): 1 mg via INTRAVENOUS

## 2019-07-13 MED ORDER — LIDOCAINE HCL (CARDIAC) PF 100 MG/5ML IV SOSY
PREFILLED_SYRINGE | INTRAVENOUS | Status: DC | PRN
  Administered 2019-07-13: 30 mg via INTRAVENOUS

## 2019-07-13 MED ORDER — CHLORHEXIDINE GLUCONATE 4 % EX LIQD: 60.0000 mL | Freq: Once | CUTANEOUS | Status: DC

## 2019-07-13 MED ORDER — OXYCODONE HCL 5 MG PO TABS: 5.0000 mg | ORAL_TABLET | Freq: Once | ORAL | Status: DC | PRN

## 2019-07-13 MED ORDER — LACTATED RINGERS IV SOLN
INTRAVENOUS | Status: DC
  Administered 2019-07-13: 09:00:00 via INTRAVENOUS

## 2019-07-13 MED ORDER — ROPIVACAINE HCL 7.5 MG/ML IJ SOLN
INTRAMUSCULAR | Status: DC | PRN
  Administered 2019-07-13 (×4): 5 mL via PERINEURAL

## 2019-07-13 MED ORDER — CEFAZOLIN SODIUM-DEXTROSE 2-4 GM/100ML-% IV SOLN
INTRAVENOUS | Status: AC
  Filled 2019-07-13: qty 100

## 2019-07-13 MED ORDER — MEPERIDINE HCL 25 MG/ML IJ SOLN: 6.2500 mg | INTRAMUSCULAR | Status: DC | PRN

## 2019-07-13 MED ORDER — ONDANSETRON HCL 4 MG/2ML IJ SOLN
INTRAMUSCULAR | Status: DC | PRN
  Administered 2019-07-13: 4 mg via INTRAVENOUS

## 2019-07-13 MED ORDER — ACETAMINOPHEN 325 MG PO TABS: 325.0000 mg | ORAL_TABLET | ORAL | Status: DC | PRN

## 2019-07-13 SURGICAL SUPPLY — 75 items
APL PRP STRL LF DISP 70% ISPRP (MISCELLANEOUS) ×1
BLADE EAR TYMPAN 2.5 60D BEAV (BLADE) IMPLANT
BLADE MINI RND TIP GREEN BEAV (BLADE) IMPLANT
BLADE SURG 15 STRL LF DISP TIS (BLADE) ×1 IMPLANT
BLADE SURG 15 STRL SS (BLADE) ×3
BNDG CMPR 9X4 STRL LF SNTH (GAUZE/BANDAGES/DRESSINGS) ×1
BNDG COHESIVE 3X5 TAN STRL LF (GAUZE/BANDAGES/DRESSINGS) ×3 IMPLANT
BNDG ESMARK 4X9 LF (GAUZE/BANDAGES/DRESSINGS) ×2 IMPLANT
BNDG GAUZE ELAST 4 BULKY (GAUZE/BANDAGES/DRESSINGS) ×3 IMPLANT
CANISTER SUCT 1200ML W/VALVE (MISCELLANEOUS) IMPLANT
CHLORAPREP W/TINT 26 (MISCELLANEOUS) ×3 IMPLANT
CORD BIPOLAR FORCEPS 12FT (ELECTRODE) IMPLANT
COVER BACK TABLE REUSABLE LG (DRAPES) ×3 IMPLANT
COVER MAYO STAND REUSABLE (DRAPES) ×3 IMPLANT
COVER WAND RF STERILE (DRAPES) IMPLANT
CUFF TOURN SGL QUICK 18X4 (TOURNIQUET CUFF) ×2 IMPLANT
DRAPE EXTREMITY T 121X128X90 (DISPOSABLE) ×3 IMPLANT
DRAPE IMP U-DRAPE 54X76 (DRAPES) ×3 IMPLANT
DRAPE OEC MINIVIEW 54X84 (DRAPES) IMPLANT
DRAPE SURG 17X23 STRL (DRAPES) ×3 IMPLANT
ELECT SMALL JOINT 90D BASC (ELECTRODE) IMPLANT
GAUZE SPONGE 4X4 12PLY STRL (GAUZE/BANDAGES/DRESSINGS) ×3 IMPLANT
GAUZE XEROFORM 1X8 LF (GAUZE/BANDAGES/DRESSINGS) ×3 IMPLANT
GLOVE BIO SURGEON STRL SZ 6.5 (GLOVE) ×1 IMPLANT
GLOVE BIO SURGEONS STRL SZ 6.5 (GLOVE) ×1
GLOVE BIOGEL M STRL SZ7.5 (GLOVE) ×2 IMPLANT
GLOVE BIOGEL PI IND STRL 7.0 (GLOVE) IMPLANT
GLOVE BIOGEL PI IND STRL 8.5 (GLOVE) ×1 IMPLANT
GLOVE BIOGEL PI INDICATOR 7.0 (GLOVE) ×4
GLOVE BIOGEL PI INDICATOR 8.5 (GLOVE) ×2
GLOVE SURG ORTHO 8.0 STRL STRW (GLOVE) ×3 IMPLANT
GOWN STRL REUS W/ TWL LRG LVL3 (GOWN DISPOSABLE) ×1 IMPLANT
GOWN STRL REUS W/TWL LRG LVL3 (GOWN DISPOSABLE) ×3
GOWN STRL REUS W/TWL XL LVL3 (GOWN DISPOSABLE) ×5 IMPLANT
IV NS IRRIG 3000ML ARTHROMATIC (IV SOLUTION) ×3 IMPLANT
IV SET EXT 30 76VOL 4 MALE LL (IV SETS) ×3 IMPLANT
MANIFOLD NEPTUNE II (INSTRUMENTS) IMPLANT
NDL EPIDURAL TUOHY 20GX3.5 (NEEDLE) IMPLANT
NDL SAFETY ECLIPSE 18X1.5 (NEEDLE) ×3 IMPLANT
NDL SPNL 18GX3.5 QUINCKE PK (NEEDLE) IMPLANT
NEEDLE HYPO 18GX1.5 SHARP (NEEDLE) ×9
NEEDLE HYPO 22GX1.5 SAFETY (NEEDLE) ×3 IMPLANT
NEEDLE SPNL 18GX3.5 QUINCKE PK (NEEDLE) IMPLANT
NEEDLE TUOHY 20GX3.5 (NEEDLE) IMPLANT
NS IRRIG 1000ML POUR BTL (IV SOLUTION) ×2 IMPLANT
PACK BASIN DAY SURGERY FS (CUSTOM PROCEDURE TRAY) ×3 IMPLANT
PAD CAST 3X4 CTTN HI CHSV (CAST SUPPLIES) ×1 IMPLANT
PADDING CAST ABS 3INX4YD NS (CAST SUPPLIES) ×2
PADDING CAST ABS 4INX4YD NS (CAST SUPPLIES) ×2
PADDING CAST ABS COTTON 3X4 (CAST SUPPLIES) ×1 IMPLANT
PADDING CAST ABS COTTON 4X4 ST (CAST SUPPLIES) ×1 IMPLANT
PADDING CAST COTTON 3X4 STRL (CAST SUPPLIES) ×3
SET SM JOINT TUBING/CANN (CANNULA) IMPLANT
SHAVER DISSECTOR 3.0 (BURR) IMPLANT
SHAVER SABRE 2.0 (BURR) ×2 IMPLANT
SLEEVE SCD COMPRESS KNEE MED (MISCELLANEOUS) ×2 IMPLANT
SPLINT PLASTER CAST XFAST 3X15 (CAST SUPPLIES) IMPLANT
SPLINT PLASTER XTRA FASTSET 3X (CAST SUPPLIES)
STOCKINETTE 4X48 STRL (DRAPES) ×3 IMPLANT
SUCTION FRAZIER HANDLE 10FR (MISCELLANEOUS)
SUCTION TUBE FRAZIER 10FR DISP (MISCELLANEOUS) IMPLANT
SUT ETHILON 4 0 PS 2 18 (SUTURE) ×3 IMPLANT
SUT MERSILENE 4 0 P 3 (SUTURE) IMPLANT
SUT PDS AB 2-0 CT2 27 (SUTURE) IMPLANT
SUT STEEL 4 0 (SUTURE) IMPLANT
SUT VIC AB 2-0 PS2 27 (SUTURE) IMPLANT
SUT VICRYL 4-0 PS2 18IN ABS (SUTURE) IMPLANT
SYR BULB 3OZ (MISCELLANEOUS) ×3 IMPLANT
SYR CONTROL 10ML LL (SYRINGE) ×3 IMPLANT
TUBE CONNECTING 20'X1/4 (TUBING)
TUBE CONNECTING 20X1/4 (TUBING) IMPLANT
TUBING ARTHROSCOPY IRRIG 16FT (MISCELLANEOUS) IMPLANT
UNDERPAD 30X36 HEAVY ABSORB (UNDERPADS AND DIAPERS) ×3 IMPLANT
WAND 1.5 MICROBLATOR (SURGICAL WAND) IMPLANT
WATER STERILE IRR 1000ML POUR (IV SOLUTION) ×3 IMPLANT

## 2019-07-13 NOTE — Progress Notes (Signed)
AssistedDr. Hatchett with right, ultrasound guided, supraclavicular block. Side rails up, monitors on throughout procedure. See vital signs in flow sheet. Tolerated Procedure well.  

## 2019-07-13 NOTE — Anesthesia Procedure Notes (Signed)
Anesthesia Regional Block: Supraclavicular block   Pre-Anesthetic Checklist: ,, timeout performed, Correct Patient, Correct Site, Correct Laterality, Correct Procedure, Correct Position, site marked, Risks and benefits discussed,  Surgical consent,  Pre-op evaluation,  At surgeon's request and post-op pain management  Laterality: Upper and Right  Prep: chloraprep       Needles:  Injection technique: Single-shot  Needle Type: Echogenic Stimulator Needle     Needle Length: 10cm  Needle Gauge: 21   Needle insertion depth: 1.5 cm   Additional Needles:   Procedures:,,,, ultrasound used (permanent image in chart),,,,  Narrative:  Start time: 07/13/2019 8:57 AM End time: 07/13/2019 9:07 AM Injection made incrementally with aspirations every 5 mL.  Performed by: Personally  Anesthesiologist: Lyn Hollingshead, MD

## 2019-07-13 NOTE — Transfer of Care (Signed)
Immediate Anesthesia Transfer of Care Note  Patient: Tony [name]  Procedure(s) Performed: WRIST ARTHROSCOPY WITH DEBRIDEMENT (Right Wrist)  Patient Location: PACU  Anesthesia Type:MAC combined with regional for post-op pain  Level of Consciousness: drowsy and patient cooperative  Airway & Oxygen Therapy: Patient Spontanous Breathing and Patient connected to face mask oxygen  Post-op Assessment: Report given to RN and Post -op Vital signs reviewed and stable  Post vital signs: Reviewed and stable  Last Vitals:  Vitals Value Taken Time  BP    Temp    Pulse 67 07/13/19 1031  Resp    SpO2 96 % 07/13/19 1031  Vitals shown include unvalidated device data.  Last Pain:  Vitals:   07/13/19 0844  TempSrc: Oral  PainSc: 0-No pain      Patients Stated Pain Goal: 3 (43/27/61 [id number])  Complications: No apparent anesthesia complications
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
## 2019-07-13 NOTE — Anesthesia Postprocedure Evaluation (Addendum)
Anesthesia Post Note  Patient: Tony [name]  Procedure(s) Performed: WRIST ARTHROSCOPY WITH DEBRIDEMENT (Right Wrist)     Patient location during evaluation: Phase II Anesthesia Type: Regional and MAC Level of consciousness: awake Pain management: pain level controlled Vital Signs Assessment: post-procedure vital signs reviewed and stable Respiratory status: spontaneous breathing Cardiovascular status: stable Postop Assessment: no apparent nausea or vomiting Anesthetic complications: no    Last Vitals:  Vitals:   07/13/19 1115 07/13/19 1120  BP: 123/88   Pulse: 74 68  Resp: 11 14  Temp:    SpO2: 99% 96%    Last Pain:  Vitals:   07/13/19 1115  TempSrc:   PainSc: 0-No pain   Pain Goal: Patients Stated Pain Goal: 3 (07/13/19 0844)                 Huston Foley

## 2019-07-13 NOTE — Anesthesia Preprocedure Evaluation (Addendum)
Anesthesia Evaluation  Patient identified by MRN, date of birth, ID band Patient awake    Airway Mallampati: I       Dental no notable dental hx. (+) Teeth Intact   Pulmonary neg pulmonary ROS,    Pulmonary exam normal breath sounds clear to auscultation       Cardiovascular negative cardio ROS Normal cardiovascular exam Rhythm:Regular Rate:Normal     Neuro/Psych PSYCHIATRIC DISORDERS Depression    GI/Hepatic negative GI ROS, Neg liver ROS,   Endo/Other  negative endocrine ROS  Renal/GU negative Renal ROS  negative genitourinary   Musculoskeletal   Abdominal Normal abdominal exam  (+)   Peds  Hematology negative hematology ROS (+)   Anesthesia Other Findings   Reproductive/Obstetrics                             Anesthesia Physical Anesthesia Plan  ASA: II  Anesthesia Plan: MAC and Regional   Post-op Pain Management:  Regional for Post-op pain   Induction: Intravenous  PONV Risk Score and Plan: 2 and Ondansetron, Dexamethasone and Midazolam  Airway Management Planned: LMA  Additional Equipment: None  Intra-op Plan:   Post-operative Plan:   Informed Consent: I have reviewed the patients History and Physical, chart, labs and discussed the procedure including the risks, benefits and alternatives for the proposed anesthesia with the patient or authorized representative who has indicated his/her understanding and acceptance.     Dental advisory given  Plan Discussed with: CRNA  Anesthesia Plan Comments:        Anesthesia Quick Evaluation

## 2019-07-13 NOTE — Discharge Instructions (Addendum)

## 2019-07-13 NOTE — Brief Op Note (Signed)
07/13/2019  10:18 AM  PATIENT:  Della Goo  53 y.o. male  PRE-OPERATIVE DIAGNOSIS:  TRIANGULAR FIBROCARTILAGE TEAR RIGHT WRIST  POST-OPERATIVE DIAGNOSIS:  * No post-op diagnosis entered *  PROCEDURE:  Procedure(s) with comments: WRIST ARTHROSCOPY WITH DEBRIDEMENT AND POSSIBLE REPAIR (Right) - AXILLARY BLOCK  SURGEON:  Surgeon(s) and Role:    * [name], MD - Primary  PHYSICIAN ASSISTANT:   ASSISTANTS: R Dasnoit,PAC   ANESTHESIA:   regional and IV sedation  EBL: 20ml BLOOD ADMINISTERED:none  DRAINS: none   LOCAL MEDICATIONS USED:  NONE  SPECIMEN:  No Specimen  DISPOSITION OF SPECIMEN:  N/A  COUNTS:  YES  TOURNIQUET:  * Missing tourniquet times found for documented tourniquets in log: [id number] *  DICTATION: .Dragon Dictation  PLAN OF CARE: Discharge to home after PACU  PATIENT DISPOSITION:  PACU - hemodynamically stable.

## 2019-07-13 NOTE — Anesthesia Procedure Notes (Signed)
Procedure Name: MAC Date/Time: 07/13/2019 9:41 AM Performed by: Signe Colt, CRNA Pre-anesthesia Checklist: Patient identified, Emergency Drugs available, Suction available, Patient being monitored and Timeout performed Patient Re-evaluated:Patient Re-evaluated prior to induction Oxygen Delivery Method: Simple face mask

## 2019-07-13 NOTE — Op Note (Signed)
NAME: Tony [name]: [id number] DATE OF BIRTH: 06/09/1966 FACILITY: [facility] LOCATION: [facility] PHYSICIAN: Tony Reaper, MD   OPERATIVE REPORT   DATE OF PROCEDURE: 07/13/19    PREOPERATIVE DIAGNOSIS:   Lunotriquetral tear right wrist stability TFCC tear   POSTOPERATIVE DIAGNOSIS:   Lunotriquetral tear   PROCEDURE:   Arthroscopy with debridement lunotriquetral tear debridement of midcarpal joint right wrist   SURGEON: Tony [name], M.D.   ASSISTANT: Tony [name], [facility]   ANESTHESIA:  Regional with sedation   INTRAVENOUS FLUIDS:  Per anesthesia flow sheet.   ESTIMATED BLOOD LOSS:  Minimal.   COMPLICATIONS:  None.   SPECIMENS:  none   TOURNIQUET TIME:   * Missing tourniquet times found for documented tourniquets in log: [id number] *   DISPOSITION:  Stable to PACU.   INDICATIONS: Patient is a 53 year old male with a history of injury of his right wrist he is undergone arthroscopy in the past with shrinkage of the scapholunate lunotriquetral ligament for tears.  The fell sustaining a new injury complains of pain on the ulnar aspect of his wrist.  This has not responded to conservative treatment.  He is agreed to undergo repeat scoping with possible debridement as dictated by findings along with repair.  Pre-peri-postoperative course been discussed along with risk complications.  He is aware that there is [name] guarantee to the surgery the possibility of infection recurrence injury to arteries nerves tendons complete relief symptoms and dystrophy.  In the preoperative area the patient is seen extremity marked by both patient and surgeon antibiotic given a supraclavicular block was carried out without difficulty under the direction the anesthesia department in the preoperative area.  OPERATIVE COURSE: Patient is brought to the operating room where he was placed in the supine position with the right arm free a three-minute dry time was allowed after a  ChloraPrep prep.  A timeout was taken to confirm patient procedure.  The right limb was placed in the arc arthroscopy tower 10 pounds traction applied.  The joint was inflated to the 3-4 portal.  A transverse incision was made deepened with a hemostat blunt trocar was used to enter the joint joint was inspected with the had normal articular surface of the distal radius proximal carpal row along the entire surface.  The scapholunate ligament was [name] longer patulous.  The volar radial wrist ligaments were intact.  The TFCC was intact with a normal trampoline effect of which was easily visualized after placement of an 18-gauge needle in the 6U portal for irrigation and a transverse incision made over the 4-5 portal deepened with a hemostat and entered with a trocar and a probe introduced.  The lunotriquetral joint was obviously torn with a flap.  This was easily probed with the probe.  The portal was enlarged and the scope transferred from 3 4-4 5 and the TFCC was inspected found to be intact a moderate synovitis was present on the ulnar aspect secondary to irritation from the lunotriquetral flap.  The scope was reintroduced into 3 for a 2 mm full-radius shaver was introduced in the ulnar portal and a debridement of the torn portion of the lunotriquetral ligament was performed.  This area was smoothed the synovitis removed with a shaver.  The ulnar midcarpal portal was then localized with a 22-gauge needle transverse incision made deepened with a hemostat blunt trocar used to enter the joint scope was introduced moderate synovitis was present in the midcarpal joint.  There is  irritation at the base of the hamate lunate articular surface.  A radial midcarpal portal was then localized with a 22-gauge needle transverse incision made deepened with a hemostat blunt trocar used to enter the joint the scope was introduced on the radial side there was [name] instability of the scapholunate there was minimal instability  lunotriquetral joint the STT joint showed [name] changes he had a type II lunate with some pointing of the articular surface of the capitate but [name] articular damage.  The lunotriquetral hamate area was then debrided with a full-radius shaver.  This revealed [name] further fibrillations.  The instruments were removed and the portals closed with interrupted 4-0 nylon sutures.  A sterile compressive dressing volar splint was applied.  The patient was taken to the recovery room for observation in satisfactory condition.  He will be discharged home to return hand center [facility] in 1 week on Tylenol and Ultram as a backup.   Tony Brod, MD Electronically signed, 07/13/19

## 2019-07-14 ENCOUNTER — Encounter (HOSPITAL_BASED_OUTPATIENT_CLINIC_OR_DEPARTMENT_OTHER): Payer: Self-pay | Admitting: Orthopedic Surgery

## 2021-04-14 IMAGING — XA FLUORO GUIDED NEEDLE PLACEMENT AND/OR ASPIRATION
3 series · 3 of 3 positions shown · non-contrast
Comparison: none

CLINICAL DATA: Right wrist pain.  Lunotriquetral ligament tear.

[Series 1: ortho adipose · 1 of 1 slices shown (1 of 3)]
[im 1/1]
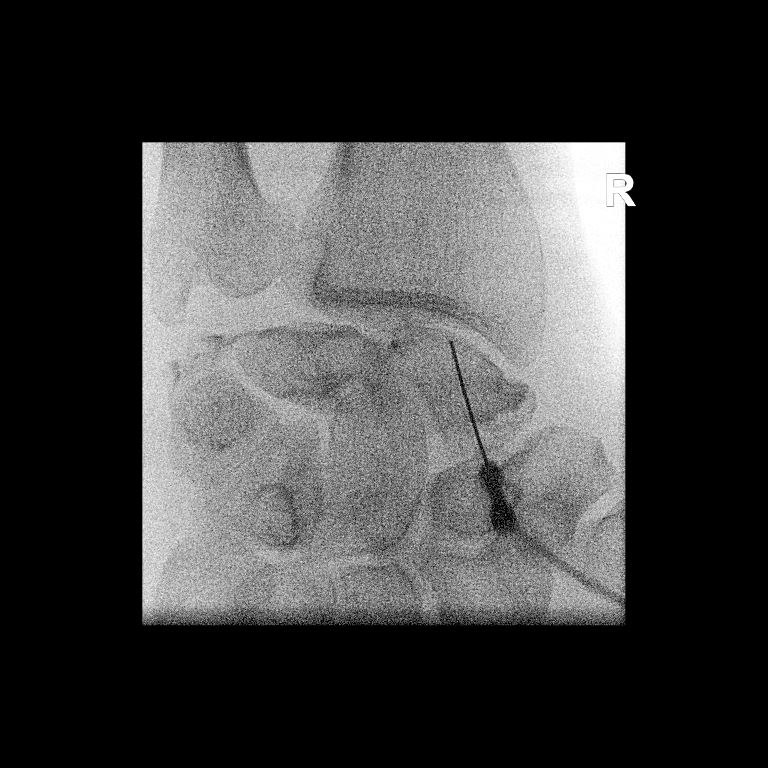

[Series 2: ortho adipose · 1 of 1 slices shown (2 of 3)]
[im 1/1]
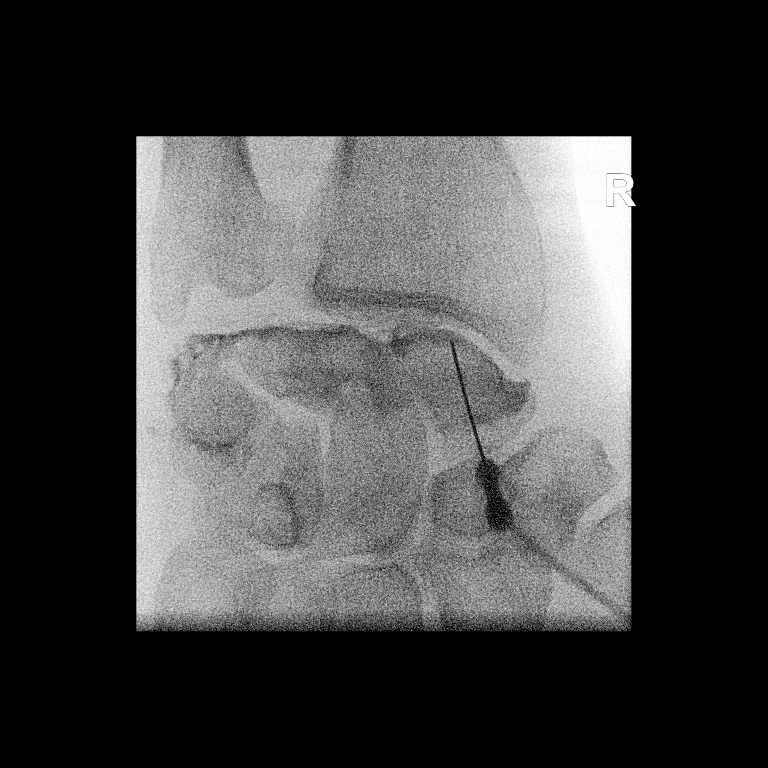

[Series 3: ortho adipose · 1 of 1 slices shown (3 of 3)]
[im 1/1]
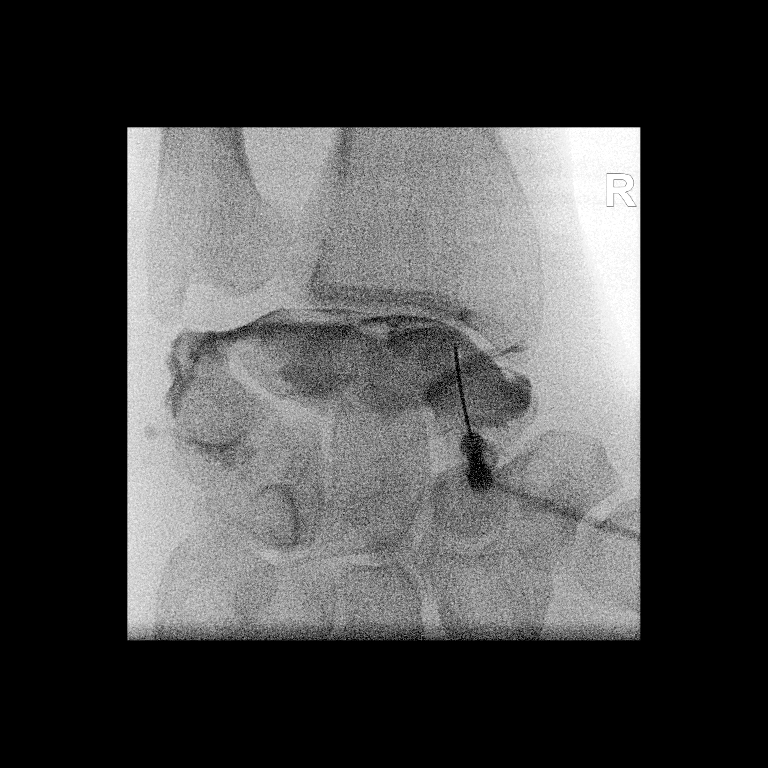

[3 of 3 positions shown; findings below may reference images not displayed]

FLUOROSCOPY TIME:  Fluoroscopy Time: 4 seconds

Radiation Exposure Index: 0.29 microGray*m^2

PROCEDURE:
RIGHT WRIST INJECTION UNDER FLUOROSCOPY

An appropriate skin entrance site was determined. The site was
marked, prepped with Betadine, draped in the usual sterile fashion,
and infiltrated locally with 1% Lidocaine. A 25 gauge skin needle
was advanced into the radiocarpal joint under intermittent
fluoroscopy. A mixture of 0.1 mL of MultiHance, 15 mL of Isovue-M
200, and 5 mL of sterile saline was then used to opacify the
proximal carpal joint. 1.5 mL of this mixture were injected. No
immediate complication.
IMPRESSION: Technically successful right wrist injection for MRI.
